# Patient Record
Sex: Female | Born: 1960 | Race: White | Hispanic: No | Marital: Single | Smoking: Never smoker
Health system: Southern US, Community
[De-identification: ages and names within clinical notes are randomized; demographics above are authoritative.]

## PROBLEM LIST (undated history)

## (undated) HISTORY — PX: CHOLECYSTECTOMY: SHX55

---

## 2009-05-12 ENCOUNTER — Encounter: Admission: RE | Admit: 2009-05-12 | Discharge: 2009-05-12 | Payer: Self-pay | Admitting: Surgery

## 2009-05-19 ENCOUNTER — Encounter (INDEPENDENT_AMBULATORY_CARE_PROVIDER_SITE_OTHER): Payer: Self-pay | Admitting: Surgery

## 2009-05-19 ENCOUNTER — Ambulatory Visit (HOSPITAL_COMMUNITY): Admission: RE | Admit: 2009-05-19 | Discharge: 2009-05-20 | Payer: Self-pay | Admitting: Surgery

## 2011-01-01 LAB — CBC
Hemoglobin: 12.3 g/dL (ref 12.0–15.0)
MCHC: 34.4 g/dL (ref 30.0–36.0)
RBC: 4.14 MIL/uL (ref 3.87–5.11)
RDW: 16.7 % — ABNORMAL HIGH (ref 11.5–15.5)

## 2011-01-01 LAB — COMPREHENSIVE METABOLIC PANEL
ALT: 49 U/L — ABNORMAL HIGH (ref 0–35)
AST: 38 U/L — ABNORMAL HIGH (ref 0–37)
Alkaline Phosphatase: 113 U/L (ref 39–117)
Calcium: 9.4 mg/dL (ref 8.4–10.5)
GFR calc Af Amer: >60 mL/min (ref >60)
Potassium: 4.2 mEq/L (ref 3.5–5.1)
Sodium: 139 mEq/L (ref 135–145)
Total Protein: 6.3 g/dL (ref 6.0–8.3)

## 2011-01-01 LAB — HEMOGLOBIN AND HEMATOCRIT, BLOOD
HCT: 38.2 % (ref 36.0–46.0)
Hemoglobin: 12.9 g/dL (ref 12.0–15.0)

## 2011-01-01 LAB — PREGNANCY, URINE: Preg Test, Ur: NEGATIVE

## 2011-02-08 NOTE — Op Note (Signed)
NAMEATHALIAH, [name]                ACCOUNT NO.:  [id number]   MEDICAL RECORD NO.:  [id number]          PATIENT TYPE:  OIB   LOCATION:  1509                         FACILITY:  [facility]   PHYSICIAN:  Thornton Park. Daphine Deutscher, MD  DATE OF BIRTH:  Sep 19, 1961   DATE OF PROCEDURE:  05/19/2009  DATE OF DISCHARGE:                               OPERATIVE REPORT   PREOPERATIVE DIAGNOSIS:  Cholecystitis.   POSTOPERATIVE DIAGNOSIS:  Chronic cholecystitis.   PROCEDURE:  Laparoscopic cholecystectomy with intraoperative  cholangiogram.   SURGEON:  Luretha Murphy, M.D.   ASSISTANT:  Dr. Ezzard Standing.   ANESTHESIA:  General endotracheal.   FINDINGS:  A normal intraoperative cholangiogram but did show some long  cystic duct remnant and some retrograde pancreatic filling up the duct  [place].   DESCRIPTION OF PROCEDURE:  Teresa Juarez is a 50 year old lady brought to  the OR on Tuesday, May 19, 2009, given general anesthesia.  The  abdomen was prepped with a Techni-Care equivalent and draped sterilely.  A longitudinal incision was made down in her umbilicus, and I entered  the abdomen without difficulty using Hassan technique.  The abdomen was  insufflated and surveyed, and eventually I did notice that she may well  have had some endometriosis in the pelvis.  First of all, though, we  grasped the gallbladder and elevated it.  She had some adhesions to the  gallbladder fundus and going down to the infundibulum.  I stripped these  away using the hook electrocautery.  I then dissected free Calot's  triangle.  This was very stuck and evidence of some chronic inflammatory  changes going on.   Once I got this delineated, I put a clip up on the gallbladder and  incised the cystic duct and did a dynamic cholangiogram with a Reddick  catheter.  This showed a long cystic duct with retrograde filling up  into the intrahepatic radicals, showing both right and left hepatic  radicals, and then it demonstrated some  filling distally and free flow  in the duodenum with a little squirt going back up in the pancreatic  radicals.  The cystic duct was then triple clipped and divided, and the  cystic artery was double clipped and divided, and the gallbladder was  removed without entering it using hook electrocautery.  The gallbladder  bed was inspected, and no bleeding or bile leaks were noted.  The  gallbladder itself was put into a bag and brought out through the  umbilicus.   Again looking in the pelvis, we looked at what we thought was probably  some endometriosis changes there.  The umbilical defect was repaired  with three sutures of 0 Vicryl tied down, and then all ports were  injected with Marcaine and closed with 4-0 Vicryl subcutaneously with  Benzoin and Steri-Strips.  The patient tolerated the procedure well and  was taken to the recovery room in satisfactory condition.      Thornton Park Daphine Deutscher, MD  Electronically Signed     MBM/MEDQ  D:  05/19/2009  T:  05/19/2009  Job:  [id number]

## 2015-03-06 ENCOUNTER — Other Ambulatory Visit: Payer: Self-pay | Admitting: Internal Medicine

## 2015-03-06 DIAGNOSIS — R0989 Other specified symptoms and signs involving the circulatory and respiratory systems: Secondary | ICD-10-CM

## 2015-03-18 ENCOUNTER — Other Ambulatory Visit: Payer: Self-pay | Admitting: Internal Medicine

## 2015-03-18 ENCOUNTER — Ambulatory Visit
Admission: RE | Admit: 2015-03-18 | Discharge: 2015-03-18 | Disposition: A | Discharge status: Home / Self Care | Payer: BLUE CROSS/BLUE SHIELD | Source: Ambulatory Visit | Attending: Internal Medicine | Admitting: Internal Medicine

## 2015-03-18 DIAGNOSIS — R0989 Other specified symptoms and signs involving the circulatory and respiratory systems: Secondary | ICD-10-CM

## 2015-07-22 ENCOUNTER — Emergency Department (HOSPITAL_COMMUNITY): Payer: BLUE CROSS/BLUE SHIELD

## 2015-07-22 ENCOUNTER — Emergency Department (HOSPITAL_COMMUNITY)
Admission: EM | Admit: 2015-07-22 | Discharge: 2015-07-22 | Disposition: A | Discharge status: Home / Self Care | Payer: BLUE CROSS/BLUE SHIELD | Attending: Emergency Medicine | Admitting: Emergency Medicine

## 2015-07-22 ENCOUNTER — Encounter (HOSPITAL_COMMUNITY): Payer: Self-pay | Admitting: Emergency Medicine

## 2015-07-22 DIAGNOSIS — S299XXA Unspecified injury of thorax, initial encounter: Secondary | ICD-10-CM | POA: Diagnosis not present

## 2015-07-22 DIAGNOSIS — Y998 Other external cause status: Secondary | ICD-10-CM | POA: Diagnosis not present

## 2015-07-22 DIAGNOSIS — S3991XA Unspecified injury of abdomen, initial encounter: Secondary | ICD-10-CM | POA: Diagnosis present

## 2015-07-22 DIAGNOSIS — Y92410 Unspecified street and highway as the place of occurrence of the external cause: Secondary | ICD-10-CM | POA: Insufficient documentation

## 2015-07-22 DIAGNOSIS — S0081XA Abrasion of other part of head, initial encounter: Secondary | ICD-10-CM | POA: Insufficient documentation

## 2015-07-22 DIAGNOSIS — Y9389 Activity, other specified: Secondary | ICD-10-CM | POA: Insufficient documentation

## 2015-07-22 DIAGNOSIS — S62609A Fracture of unspecified phalanx of unspecified finger, initial encounter for closed fracture: Secondary | ICD-10-CM

## 2015-07-22 DIAGNOSIS — S301XXA Contusion of abdominal wall, initial encounter: Secondary | ICD-10-CM

## 2015-07-22 DIAGNOSIS — S70912A Unspecified superficial injury of left hip, initial encounter: Secondary | ICD-10-CM | POA: Insufficient documentation

## 2015-07-22 DIAGNOSIS — S62644A Nondisplaced fracture of proximal phalanx of right ring finger, initial encounter for closed fracture: Secondary | ICD-10-CM | POA: Insufficient documentation

## 2015-07-22 DIAGNOSIS — S4991XA Unspecified injury of right shoulder and upper arm, initial encounter: Secondary | ICD-10-CM | POA: Insufficient documentation

## 2015-07-22 LAB — I-STAT CHEM 8, ED
BUN: 15 mg/dL (ref 6–20)
CALCIUM ION: 1.22 mmol/L (ref 1.12–1.23)
CHLORIDE: 102 mmol/L (ref 101–111)
CREATININE: 0.7 mg/dL (ref 0.44–1.00)
Glucose, Bld: 122 mg/dL — ABNORMAL HIGH (ref 65–99)
HEMATOCRIT: 38 % (ref 36.0–46.0)
Hemoglobin: 12.9 g/dL (ref 12.0–15.0)
Potassium: 3.6 mmol/L (ref 3.5–5.1)
SODIUM: 140 mmol/L (ref 135–145)
TCO2: 25 mmol/L (ref 0–100)

## 2015-07-22 LAB — URINALYSIS, ROUTINE W REFLEX MICROSCOPIC
Bilirubin Urine: NEGATIVE
Glucose, UA: NEGATIVE mg/dL
Hgb urine dipstick: NEGATIVE
Ketones, ur: NEGATIVE mg/dL
Leukocytes, UA: NEGATIVE
NITRITE: NEGATIVE
PH: 5.5 (ref 5.0–8.0)
Protein, ur: NEGATIVE mg/dL
SPECIFIC GRAVITY, URINE: 1.015 (ref 1.005–1.030)
UROBILINOGEN UA: 0.2 mg/dL (ref 0.0–1.0)

## 2015-07-22 MED ORDER — IOHEXOL 300 MG/ML  SOLN
100.0000 mL | Freq: Once | INTRAMUSCULAR | Status: AC | PRN
  Administered 2015-07-22: 100 mL via INTRAVENOUS

## 2015-07-22 MED ORDER — HYDROCODONE-ACETAMINOPHEN 5-325 MG PO TABS
1.0000 | ORAL_TABLET | Freq: Once | ORAL | Status: AC
Start: 2015-07-22 — End: 2015-07-22
  Administered 2015-07-22: 1 via ORAL
  Filled 2015-07-22: qty 1

## 2015-07-22 MED ORDER — IBUPROFEN 800 MG PO TABS: 800.0000 mg | ORAL_TABLET | Freq: Three times a day (TID) | ORAL | Status: AC

## 2015-07-22 MED ORDER — HYDROCODONE-ACETAMINOPHEN 5-325 MG PO TABS: 1.0000 | ORAL_TABLET | ORAL | Status: AC | PRN
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
## 2015-07-22 NOTE — ED Notes (Signed)
Pt ambulated to bathroom independently with no problems. 

## 2015-07-22 NOTE — ED Notes (Signed)
Pt declined pain medication at this time, did state that she will probably need something later to help her rest ,and most discomfort is coming from her shoulder

## 2015-07-22 NOTE — ED Notes (Signed)
Discharge instructions reviewed with pt , discussed  Use of pain medications and non pharmaceutical interventions for pain. Pt verbalized understanding. Ambulated off unit with friend.
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
## 2018-09-03 DIAGNOSIS — Z79899 Other long term (current) drug therapy: Secondary | ICD-10-CM | POA: Diagnosis not present

## 2018-09-03 DIAGNOSIS — L700 Acne vulgaris: Secondary | ICD-10-CM | POA: Diagnosis not present

## 2018-09-06 DIAGNOSIS — E039 Hypothyroidism, unspecified: Secondary | ICD-10-CM | POA: Diagnosis not present

## 2018-09-06 DIAGNOSIS — Z0000 Encounter for general adult medical examination without abnormal findings: Secondary | ICD-10-CM | POA: Diagnosis not present

## 2018-09-13 DIAGNOSIS — R32 Unspecified urinary incontinence: Secondary | ICD-10-CM | POA: Diagnosis not present

## 2018-09-13 DIAGNOSIS — Z0000 Encounter for general adult medical examination without abnormal findings: Secondary | ICD-10-CM | POA: Diagnosis not present

## 2018-09-13 DIAGNOSIS — E039 Hypothyroidism, unspecified: Secondary | ICD-10-CM | POA: Diagnosis not present

## 2018-09-13 DIAGNOSIS — R0989 Other specified symptoms and signs involving the circulatory and respiratory systems: Secondary | ICD-10-CM | POA: Diagnosis not present

## 2018-10-03 DIAGNOSIS — Z79899 Other long term (current) drug therapy: Secondary | ICD-10-CM | POA: Diagnosis not present

## 2018-10-03 DIAGNOSIS — L700 Acne vulgaris: Secondary | ICD-10-CM | POA: Diagnosis not present

## 2018-11-05 DIAGNOSIS — Z79899 Other long term (current) drug therapy: Secondary | ICD-10-CM | POA: Diagnosis not present

## 2018-11-05 DIAGNOSIS — L700 Acne vulgaris: Secondary | ICD-10-CM | POA: Diagnosis not present

## 2018-11-05 DIAGNOSIS — L308 Other specified dermatitis: Secondary | ICD-10-CM | POA: Diagnosis not present

## 2018-12-06 DIAGNOSIS — L308 Other specified dermatitis: Secondary | ICD-10-CM | POA: Diagnosis not present

## 2018-12-06 DIAGNOSIS — L700 Acne vulgaris: Secondary | ICD-10-CM | POA: Diagnosis not present

## 2018-12-06 DIAGNOSIS — Z79899 Other long term (current) drug therapy: Secondary | ICD-10-CM | POA: Diagnosis not present

## 2019-01-07 DIAGNOSIS — L700 Acne vulgaris: Secondary | ICD-10-CM | POA: Diagnosis not present

## 2019-01-07 DIAGNOSIS — K130 Diseases of lips: Secondary | ICD-10-CM | POA: Diagnosis not present

## 2019-02-13 DIAGNOSIS — Z79899 Other long term (current) drug therapy: Secondary | ICD-10-CM | POA: Diagnosis not present

## 2019-02-13 DIAGNOSIS — L821 Other seborrheic keratosis: Secondary | ICD-10-CM | POA: Diagnosis not present

## 2019-02-13 DIAGNOSIS — L814 Other melanin hyperpigmentation: Secondary | ICD-10-CM | POA: Diagnosis not present

## 2019-02-13 DIAGNOSIS — Z86018 Personal history of other benign neoplasm: Secondary | ICD-10-CM | POA: Diagnosis not present

## 2019-03-18 DIAGNOSIS — Z79899 Other long term (current) drug therapy: Secondary | ICD-10-CM | POA: Diagnosis not present

## 2019-03-18 DIAGNOSIS — L700 Acne vulgaris: Secondary | ICD-10-CM | POA: Diagnosis not present

## 2019-03-25 DIAGNOSIS — Z79899 Other long term (current) drug therapy: Secondary | ICD-10-CM | POA: Diagnosis not present

## 2019-03-25 DIAGNOSIS — L709 Acne, unspecified: Secondary | ICD-10-CM | POA: Diagnosis not present

## 2019-04-03 DIAGNOSIS — Z79899 Other long term (current) drug therapy: Secondary | ICD-10-CM | POA: Diagnosis not present

## 2019-04-03 DIAGNOSIS — H52223 Regular astigmatism, bilateral: Secondary | ICD-10-CM | POA: Diagnosis not present

## 2019-04-03 DIAGNOSIS — L700 Acne vulgaris: Secondary | ICD-10-CM | POA: Diagnosis not present

## 2019-04-03 DIAGNOSIS — H5213 Myopia, bilateral: Secondary | ICD-10-CM | POA: Diagnosis not present

## 2019-04-18 DIAGNOSIS — Z79899 Other long term (current) drug therapy: Secondary | ICD-10-CM | POA: Diagnosis not present

## 2019-04-18 DIAGNOSIS — L700 Acne vulgaris: Secondary | ICD-10-CM | POA: Diagnosis not present

## 2019-05-20 DIAGNOSIS — L700 Acne vulgaris: Secondary | ICD-10-CM | POA: Diagnosis not present

## 2019-05-20 DIAGNOSIS — L237 Allergic contact dermatitis due to plants, except food: Secondary | ICD-10-CM | POA: Diagnosis not present

## 2019-05-20 DIAGNOSIS — Z79899 Other long term (current) drug therapy: Secondary | ICD-10-CM | POA: Diagnosis not present

## 2019-06-19 DIAGNOSIS — L700 Acne vulgaris: Secondary | ICD-10-CM | POA: Diagnosis not present

## 2019-06-19 DIAGNOSIS — Z79899 Other long term (current) drug therapy: Secondary | ICD-10-CM | POA: Diagnosis not present

## 2019-06-19 DIAGNOSIS — Z23 Encounter for immunization: Secondary | ICD-10-CM | POA: Diagnosis not present

## 2019-09-14 DIAGNOSIS — Z03818 Encounter for observation for suspected exposure to other biological agents ruled out: Secondary | ICD-10-CM | POA: Diagnosis not present

## 2019-09-25 DIAGNOSIS — Z03818 Encounter for observation for suspected exposure to other biological agents ruled out: Secondary | ICD-10-CM | POA: Diagnosis not present

## 2019-11-30 ENCOUNTER — Ambulatory Visit: Payer: BLUE CROSS/BLUE SHIELD | Attending: Internal Medicine

## 2019-11-30 DIAGNOSIS — Z23 Encounter for immunization: Secondary | ICD-10-CM | POA: Insufficient documentation

## 2019-11-30 NOTE — Progress Notes (Signed)
   [facility]ov[facility] Vaccination [facility]  Name:  Guelda Batson    MRN: [id number] DOB: 1961/05/30  11/30/2019  Ms. Spikes was observed post Covid-19 immunization for 15 minutes without incident. She was provided with Vaccine Information Sheet and instruction to access the V-Safe system.   Ms. Borys was instructed to call 911 with any severe reactions post vaccine: Marland Kitchen Difficulty breathing  . Swelling of face and throat  . A fast heartbeat  . A bad rash all over body  . Dizziness and weakness   Immunizations Administered    Name Date Dose VIS Date Route   Pfizer COVID-19 Vaccine 11/30/2019 11:50 AM 0.3 mL 09/06/2019 Intramuscular   Manufacturer: [employer], [employer]   Lot: [id number]   NDC: [address]-6

## 2019-12-31 ENCOUNTER — Ambulatory Visit: Payer: BLUE CROSS/BLUE SHIELD | Attending: Internal Medicine

## 2019-12-31 DIAGNOSIS — Z23 Encounter for immunization: Secondary | ICD-10-CM

## 2019-12-31 NOTE — Progress Notes (Signed)
   Covid-19 Vaccination Clinic  Name:  Teresa Juarez    MRN: [id number] DOB: 1960-09-28  12/31/2019  Ms. Coke was observed post Covid-19 immunization for 15 minutes without incident. She was provided with Vaccine Information Sheet and instruction to access the V-Safe system.   Ms. Culverhouse was instructed to call 911 with any severe reactions post vaccine: Marland Kitchen Difficulty breathing  . Swelling of face and throat  . A fast heartbeat  . A bad rash all over body  . Dizziness and weakness   Immunizations Administered    Name Date Dose VIS Date Route   Pfizer COVID-19 Vaccine 12/31/2019  3:04 PM 0.3 mL 09/06/2019 Intramuscular   Manufacturer: [employer], [employer]   Lot: [id number]   NDC: [address]-9

## 2020-02-04 DIAGNOSIS — M25562 Pain in left knee: Secondary | ICD-10-CM | POA: Diagnosis not present

## 2020-02-18 DIAGNOSIS — D225 Melanocytic nevi of trunk: Secondary | ICD-10-CM | POA: Diagnosis not present

## 2020-02-18 DIAGNOSIS — L814 Other melanin hyperpigmentation: Secondary | ICD-10-CM | POA: Diagnosis not present

## 2020-02-18 DIAGNOSIS — L821 Other seborrheic keratosis: Secondary | ICD-10-CM | POA: Diagnosis not present

## 2020-02-18 DIAGNOSIS — Z86018 Personal history of other benign neoplasm: Secondary | ICD-10-CM | POA: Diagnosis not present

## 2020-03-06 DIAGNOSIS — M25562 Pain in left knee: Secondary | ICD-10-CM | POA: Diagnosis not present

## 2020-10-05 DIAGNOSIS — Z1152 Encounter for screening for COVID-19: Secondary | ICD-10-CM | POA: Diagnosis not present

## 2020-11-04 DIAGNOSIS — R32 Unspecified urinary incontinence: Secondary | ICD-10-CM | POA: Diagnosis not present

## 2020-11-04 DIAGNOSIS — Z13220 Encounter for screening for lipoid disorders: Secondary | ICD-10-CM | POA: Diagnosis not present

## 2020-11-04 DIAGNOSIS — E559 Vitamin D deficiency, unspecified: Secondary | ICD-10-CM | POA: Diagnosis not present

## 2020-11-04 DIAGNOSIS — R7309 Other abnormal glucose: Secondary | ICD-10-CM | POA: Diagnosis not present

## 2020-11-04 DIAGNOSIS — Z0000 Encounter for general adult medical examination without abnormal findings: Secondary | ICD-10-CM | POA: Diagnosis not present

## 2020-11-04 DIAGNOSIS — E039 Hypothyroidism, unspecified: Secondary | ICD-10-CM | POA: Diagnosis not present

## 2021-05-18 DIAGNOSIS — M25562 Pain in left knee: Secondary | ICD-10-CM | POA: Diagnosis not present

## 2021-05-18 DIAGNOSIS — M5416 Radiculopathy, lumbar region: Secondary | ICD-10-CM | POA: Diagnosis not present

## 2021-05-25 DIAGNOSIS — M6281 Muscle weakness (generalized): Secondary | ICD-10-CM | POA: Diagnosis not present

## 2021-05-25 DIAGNOSIS — M545 Low back pain, unspecified: Secondary | ICD-10-CM | POA: Diagnosis not present

## 2021-05-25 DIAGNOSIS — M5416 Radiculopathy, lumbar region: Secondary | ICD-10-CM | POA: Diagnosis not present

## 2021-06-22 DIAGNOSIS — M5416 Radiculopathy, lumbar region: Secondary | ICD-10-CM | POA: Diagnosis not present

## 2021-07-07 DIAGNOSIS — M545 Low back pain, unspecified: Secondary | ICD-10-CM | POA: Diagnosis not present

## 2021-07-12 DIAGNOSIS — M4722 Other spondylosis with radiculopathy, cervical region: Secondary | ICD-10-CM | POA: Diagnosis not present

## 2021-07-12 DIAGNOSIS — M5416 Radiculopathy, lumbar region: Secondary | ICD-10-CM | POA: Diagnosis not present

## 2021-07-12 DIAGNOSIS — M4316 Spondylolisthesis, lumbar region: Secondary | ICD-10-CM | POA: Diagnosis not present

## 2021-07-14 DIAGNOSIS — M5416 Radiculopathy, lumbar region: Secondary | ICD-10-CM | POA: Diagnosis not present

## 2021-07-26 DIAGNOSIS — M5416 Radiculopathy, lumbar region: Secondary | ICD-10-CM | POA: Diagnosis not present

## 2022-06-02 DIAGNOSIS — E039 Hypothyroidism, unspecified: Secondary | ICD-10-CM | POA: Diagnosis not present

## 2022-06-02 DIAGNOSIS — R739 Hyperglycemia, unspecified: Secondary | ICD-10-CM | POA: Diagnosis not present

## 2022-06-02 DIAGNOSIS — E559 Vitamin D deficiency, unspecified: Secondary | ICD-10-CM | POA: Diagnosis not present

## 2022-06-02 DIAGNOSIS — E785 Hyperlipidemia, unspecified: Secondary | ICD-10-CM | POA: Diagnosis not present

## 2022-06-29 DIAGNOSIS — E038 Other specified hypothyroidism: Secondary | ICD-10-CM | POA: Diagnosis not present

## 2022-06-29 DIAGNOSIS — Z0000 Encounter for general adult medical examination without abnormal findings: Secondary | ICD-10-CM | POA: Diagnosis not present

## 2022-06-29 DIAGNOSIS — Z23 Encounter for immunization: Secondary | ICD-10-CM | POA: Diagnosis not present

## 2022-08-10 DIAGNOSIS — E038 Other specified hypothyroidism: Secondary | ICD-10-CM | POA: Diagnosis not present

## 2023-02-27 DIAGNOSIS — E669 Obesity, unspecified: Secondary | ICD-10-CM | POA: Diagnosis not present

## 2023-02-27 DIAGNOSIS — E038 Other specified hypothyroidism: Secondary | ICD-10-CM | POA: Diagnosis not present

## 2023-04-12 DIAGNOSIS — M5416 Radiculopathy, lumbar region: Secondary | ICD-10-CM | POA: Diagnosis not present

## 2023-06-29 DIAGNOSIS — Z13220 Encounter for screening for lipoid disorders: Secondary | ICD-10-CM | POA: Diagnosis not present

## 2023-06-29 DIAGNOSIS — Z79899 Other long term (current) drug therapy: Secondary | ICD-10-CM | POA: Diagnosis not present

## 2023-06-29 DIAGNOSIS — R7309 Other abnormal glucose: Secondary | ICD-10-CM | POA: Diagnosis not present

## 2023-06-29 DIAGNOSIS — E559 Vitamin D deficiency, unspecified: Secondary | ICD-10-CM | POA: Diagnosis not present

## 2023-06-29 DIAGNOSIS — E038 Other specified hypothyroidism: Secondary | ICD-10-CM | POA: Diagnosis not present

## 2023-07-12 DIAGNOSIS — Z0000 Encounter for general adult medical examination without abnormal findings: Secondary | ICD-10-CM | POA: Diagnosis not present

## 2023-07-12 DIAGNOSIS — Z23 Encounter for immunization: Secondary | ICD-10-CM | POA: Diagnosis not present

## 2024-06-03 DIAGNOSIS — Z23 Encounter for immunization: Secondary | ICD-10-CM | POA: Diagnosis not present

## 2024-07-08 DIAGNOSIS — Z79899 Other long term (current) drug therapy: Secondary | ICD-10-CM | POA: Diagnosis not present

## 2024-07-08 DIAGNOSIS — R7309 Other abnormal glucose: Secondary | ICD-10-CM | POA: Diagnosis not present

## 2024-07-08 DIAGNOSIS — E038 Other specified hypothyroidism: Secondary | ICD-10-CM | POA: Diagnosis not present

## 2024-07-08 DIAGNOSIS — E559 Vitamin D deficiency, unspecified: Secondary | ICD-10-CM | POA: Diagnosis not present

## 2024-07-15 DIAGNOSIS — E669 Obesity, unspecified: Secondary | ICD-10-CM | POA: Diagnosis not present

## 2024-07-15 DIAGNOSIS — E038 Other specified hypothyroidism: Secondary | ICD-10-CM | POA: Diagnosis not present

## 2024-07-15 DIAGNOSIS — Z23 Encounter for immunization: Secondary | ICD-10-CM | POA: Diagnosis not present

## 2024-07-15 DIAGNOSIS — Z0000 Encounter for general adult medical examination without abnormal findings: Secondary | ICD-10-CM | POA: Diagnosis not present
